# Patient Record
Sex: Female | Born: 1935 | Race: Black or African American | Hispanic: No | Smoking: Never smoker
Health system: Southern US, Community
[De-identification: ages and names within clinical notes are randomized; demographics above are authoritative.]

## PROBLEM LIST (undated history)

## (undated) DIAGNOSIS — J449 Chronic obstructive pulmonary disease, unspecified: Secondary | ICD-10-CM

## (undated) DIAGNOSIS — G4733 Obstructive sleep apnea (adult) (pediatric): Secondary | ICD-10-CM

## (undated) DIAGNOSIS — I10 Essential (primary) hypertension: Secondary | ICD-10-CM

## (undated) DIAGNOSIS — D649 Anemia, unspecified: Secondary | ICD-10-CM

## (undated) HISTORY — DX: Obstructive sleep apnea (adult) (pediatric): G47.33

## (undated) HISTORY — DX: Chronic obstructive pulmonary disease, unspecified: J44.9

## (undated) HISTORY — DX: Essential (primary) hypertension: I10

## (undated) HISTORY — DX: Anemia, unspecified: D64.9

---

## 1976-11-23 HISTORY — PX: ABDOMINAL HYSTERECTOMY: SHX81

## 2002-07-01 ENCOUNTER — Emergency Department (HOSPITAL_COMMUNITY): Admission: EM | Admit: 2002-07-01 | Discharge: 2002-07-01 | Payer: Self-pay | Admitting: Internal Medicine

## 2002-07-01 ENCOUNTER — Encounter: Payer: Self-pay | Admitting: Internal Medicine

## 2005-11-23 DIAGNOSIS — D649 Anemia, unspecified: Secondary | ICD-10-CM

## 2005-11-23 HISTORY — DX: Anemia, unspecified: D64.9

## 2006-07-05 HISTORY — PX: COLONOSCOPY: SHX174

## 2010-10-21 HISTORY — PX: ESOPHAGOGASTRODUODENOSCOPY: SHX1529

## 2010-10-21 HISTORY — PX: COLONOSCOPY: SHX174

## 2011-03-26 ENCOUNTER — Ambulatory Visit: Payer: Medicare PPO | Attending: Family Medicine

## 2011-03-26 DIAGNOSIS — Z6841 Body Mass Index (BMI) 40.0 and over, adult: Secondary | ICD-10-CM | POA: Insufficient documentation

## 2011-03-26 DIAGNOSIS — G4733 Obstructive sleep apnea (adult) (pediatric): Secondary | ICD-10-CM | POA: Insufficient documentation

## 2011-04-04 NOTE — Procedures (Signed)
NAMEMORGAINE, KIMBALL                   ACCOUNT NO.:  [id number]  MEDICAL RECORD NO.:  [id number]          PATIENT TYPE:  OUT  LOCATION:  SLEEP CENTER                 FACILITY:  [facility]  PHYSICIAN:  Allin Frix A. Gerilyn Pilgrim, M.D. DATE OF BIRTH:  03-Jun-1936  DATE OF STUDY:  03/26/2011                           NOCTURNAL POLYSOMNOGRAM  REFERRING PHYSICIAN:  LLOYD COMSTOCK  REFERRING PHYSICIAN:  Reynolds Bowl.  INDICATION FOR STUDY:  A 75 year old lady who presents with a diagnosis of obstructive sleep apnea syndrome documented by previous polysomnography.  This study is a CPAP titration and recording.  EPWORTH SLEEPINESS SCORE:  Six.  BMI 42.  MEDICATIONS:  Aspirin, Diovan, hydrochlorothiazide, meclizine, ranitidine.  SLEEP ARCHITECTURE:  The total recording time is 389 minutes.  Sleep efficiency 55%.  Sleep latency 7.5 minutes.  REM latency 83.5 minutes. Stage N1 of 24, N2 of 60, N3 of 1, and REM sleep 14.  RESPIRATORY DATA:  Baseline oxygen saturation is 96, lowest saturation 87.  The patient was titrated on CPAP and subsequently bilevel pressures.  She was started out at a CPAP of 5 and titrated to a high of 16, subsequently switched over from 16 to bilevel pressures because of some difficulty tolerating this.  She was titrated between bilevels of 18/14 to 20/16.  The patient did well on 16, but had some difficulties with this.  The optimal bilevel pressure, I believe, is 18/14.  CARDIAC DATA:  Average heart rate 62 with no significant dysrhythmias observed.  MOVEMENT-PARASOMNIA:  PLM index zero.  IMPRESSIONS-RECOMMENDATIONS:  Obstructive sleep apnea syndrome which responds well to bilevel pressures of 18/14.     Aina Rossbach A. Gerilyn Pilgrim, M.D. Electronically Signed    KAD/MEDQ  D:  04/03/2011 18:21:46  T:  04/04/2011 05:45:29  Job:  [id number]

## 2012-09-05 ENCOUNTER — Ambulatory Visit (INDEPENDENT_AMBULATORY_CARE_PROVIDER_SITE_OTHER): Payer: Medicare Other | Admitting: Urgent Care

## 2012-09-05 ENCOUNTER — Telehealth: Payer: Self-pay | Admitting: Urgent Care

## 2012-09-05 ENCOUNTER — Other Ambulatory Visit: Payer: Self-pay | Admitting: Gastroenterology

## 2012-09-05 ENCOUNTER — Encounter: Payer: Self-pay | Admitting: Urgent Care

## 2012-09-05 VITALS — BP 152/68 | HR 66 | Temp 98.0°F | Ht 65.0 in | Wt 239.4 lb

## 2012-09-05 DIAGNOSIS — K219 Gastro-esophageal reflux disease without esophagitis: Secondary | ICD-10-CM

## 2012-09-05 DIAGNOSIS — R1310 Dysphagia, unspecified: Secondary | ICD-10-CM

## 2012-09-05 MED ORDER — DEXLANSOPRAZOLE 60 MG PO CPDR: 60.0000 mg | DELAYED_RELEASE_CAPSULE | Freq: Every day | ORAL | Status: DC

## 2012-09-05 NOTE — Progress Notes (Addendum)
Referring Provider:McVeigh, Lanora Manis Primary Care Physician:  Rush Barer, [place] Primary Gastroenterologist:  Dr. Jonette Eva  Chief Complaint  Patient presents with  . Abdominal Pain    burning/ lower part   HPI:  Tamara Perry is a 76 y.o. female here as a referral from Dr. Agustin Cree for GERD for 3-4 months.  C/o heartburn & indigestion every day.  C/o chronic cough.  Tried omeprazole 20mg  daily & this helped for a while, but stopped working.  C/o dysphagia with solid foods .  She states she "feels like they get stuck" & points to her upper esophagus.  Denies problems with liquids.  Denies odynophagia.  She has had symptoms for 2 months.  She continues to gain weight.  She denies any hx of GERD.   Appetite ok.  Denies constipation, diarrhea, rectal bleeding, melena or weight loss.  C/o "burning" suprapubic pain internittent worse w/ coughing & movement.  Denies fever or chills. Denies urinary complaints.  Colonoscopy approx 5 yrs ago & she believes she may have had polyps.   Past Medical History  Diagnosis Date  . HTN (hypertension)   . OSA (obstructive sleep apnea)   . COPD (chronic obstructive pulmonary disease)   . Anemia 2007    Past Surgical History  Procedure Date  . Abdominal hysterectomy 1978    complete  . Colonoscopy 07/05/2006    Dr. Cliffton Asters Jr-diverticulosis, hemorrhoids  . Esophagogastroduodenoscopy 10/21/10    George-mod HH, CLOTest ?results  . Colonoscopy 10/21/10    George-diverticulosis    Current Outpatient Prescriptions  Medication Sig Dispense Refill  . aspirin 81 MG tablet Take 81 mg by mouth daily.      . valsartan-hydrochlorothiazide (DIOVAN-HCT) 320-12.5 MG per tablet Take 1 tablet by mouth daily.       Marland Kitchen dexlansoprazole (DEXILANT) 60 MG capsule Take 1 capsule (60 mg total) by mouth daily.  10 capsule  0    Allergies as of 09/05/2012  . (No Known Allergies)    Family History:There is no known family history of colorectal carcinoma , liver disease, or  inflammatory bowel disease.  Problem Relation Age of Onset  . Coronary artery disease Mother   . Seizures Brother   . Stroke Brother     History   Social History  . Marital Status: Divorced    Spouse Name: N/A    Number of Children: 1  . Years of Education: N/A   Occupational History  . retired Engineer, manufacturing systems work    Social History Main Topics  . Smoking status: Never Smoker   . Smokeless tobacco: Not on file  . Alcohol Use: No  . Drug Use: No  . Sexually Active: Not on file   Other Topics Concern  . Not on file   Social History Narrative   Lives alone1 son lives in [place]    Review of Systems: Gen: Denies any fatigue, weakness, malaise, weight loss, and sleep disorder CV: Denies chest pain, angina, palpitations, syncope, orthopnea, PND, and claudication. +trace left ankle edema noted at times. Resp: Denies dyspnea at rest, dyspnea with exercise, sputum, wheezing, coughing up blood, and pleurisy. GI: Denies vomiting blood, jaundice, and fecal incontinence. GU : Denies urinary burning, blood in urine, urinary frequency, urinary hesitancy, nocturnal urination, and urinary incontinence. MS: Denies joint pain, limitation of movement, and swelling, stiffness, low back pain, extremity pain. Denies muscle weakness, cramps, atrophy.  Derm: Denies rash, itching, dry skin, hives, moles, warts, or unhealing ulcers.  Psych: Denies depression, anxiety, memory loss, suicidal  ideation, hallucinations, paranoia, and confusion. Heme: Denies bruising, bleeding, and enlarged lymph nodes. Neuro:  Denies any headaches, dizziness, paresthesias. Endo:  Denies any problems with DM, thyroid, adrenal function.  Physical Exam: BP 152/68  Pulse 66  Temp 98 F (36.7 C) (Temporal)  Ht 5\' 5"  (1.651 m)  Wt 239 lb 6.4 oz (108.591 kg)  BMI 39.84 kg/m2 No LMP recorded. Patient has had a hysterectomy. General:   Alert,  Well-developed, obese, pleasant and cooperative in NAD Head:  Normocephalic and  atraumatic. Eyes:  Sclera clear, no icterus.   Conjunctiva pink. Ears:  Normal auditory acuity. Nose:  No deformity, discharge, or lesions. Mouth:  No deformity or lesions,oropharynx pink & moist. Neck:  Supple; no masses or thyromegaly. Lungs:  Clear throughout to auscultation.   No wheezes, crackles, or rhonchi. No acute distress. Heart:  Regular rate and rhythm; no murmurs, clicks, rubs,  or gallops. Abdomen:  Normal bowel sounds.  No bruits.  Soft, non-tender and non-distended without masses, hepatosplenomegaly or hernias noted.  No guarding or rebound tenderness.   Rectal:  Deferred. Msk:  Symmetrical without gross deformities. Pulses:  Normal pulses noted. Extremities:  No clubbing or edema. Neurologic:  Alert and oriented x4;  grossly normal neurologically. Skin:  Intact without significant lesions or rashes. Lymph Nodes:  No significant cervical adenopathy. Psych:  Alert and cooperative. Normal mood and affect.

## 2012-09-05 NOTE — Patient Instructions (Addendum)
EGD (upper endoscopy) with Dr Darrick Penna.  She may dilate your esophagus. Begin dexilant 60mg  daily for acid reflux. Follow up with your PCP about leg cramps We will request colonoscopy report to help determine when your next colonoscopy is due  Diet for Gastroesophageal Reflux Disease, Adult Reflux (acid reflux) is when acid from your stomach flows up into the esophagus. When acid comes in contact with the esophagus, the acid causes irritation and soreness (inflammation) in the esophagus. When reflux happens often or so severely that it causes damage to the esophagus, it is called gastroesophageal reflux disease (GERD). Nutrition therapy can help ease the discomfort of GERD. FOODS OR DRINKS TO AVOID OR LIMIT  Smoking or chewing tobacco. Nicotine is one of the most potent stimulants to acid production in the gastrointestinal tract.  Caffeinated and decaffeinated coffee and black tea.  Regular or low-calorie carbonated beverages or energy drinks (caffeine-free carbonated beverages are allowed).   Strong spices, such as black pepper, white pepper, red pepper, cayenne, curry powder, and chili powder.  Peppermint or spearmint.  Chocolate.  High-fat foods, including meats and fried foods. Extra added fats including oils, butter, salad dressings, and nuts. Limit these to less than 8 tsp per day.  Fruits and vegetables if they are not tolerated, such as citrus fruits or tomatoes.  Alcohol.  Any food that seems to aggravate your condition. If you have questions regarding your diet, call your caregiver or a registered dietitian. OTHER THINGS THAT MAY HELP GERD INCLUDE:   Eating your meals slowly, in a relaxed setting.  Eating 5 to 6 small meals per day instead of 3 large meals.  Eliminating food for a period of time if it causes distress.  Not lying down until 3 hours after eating a meal.  Keeping the head of your bed raised 6 to 9 inches (15 to 23 cm) by using a foam wedge or blocks under  the legs of the bed. Lying flat may make symptoms worse.  Being physically active. Weight loss may be helpful in reducing reflux in overweight or obese adults.  Wear loose fitting clothing EXAMPLE MEAL PLAN This meal plan is approximately 2,000 calories based on https://www.bernard.org/ meal planning guidelines. Breakfast   cup cooked oatmeal.  1 cup strawberries.  1 cup low-fat milk.  1 oz almonds. Snack  1 cup cucumber slices.  6 oz yogurt (made from low-fat or fat-free milk). Lunch  2 slice whole-wheat bread.  2 oz sliced [place].  2 tsp mayonnaise.  1 cup blueberries.  1 cup snap peas. Snack  6 whole-wheat crackers.  1 oz string cheese. Dinner   cup brown rice.  1 cup mixed veggies.  1 tsp olive oil.  3 oz grilled fish. Document Released: 11/09/2005 Document Revised: 02/01/2012 Document Reviewed: 09/25/2011 [facility] Patient Information 2013 [place], [place].

## 2012-09-05 NOTE — Progress Notes (Signed)
Faxed to PCP

## 2012-09-05 NOTE — Assessment & Plan Note (Addendum)
Tamara Perry is a pleasant 76 y.o. female with refractory heartburn & indigestion.  Failed omeprazole 20mg  daily.  EGD to look for complicated GERD, gastritis or PUD.   Trial Dexilant 60mg  daily.

## 2012-09-05 NOTE — Telephone Encounter (Signed)
Faxed request, waiting on reply

## 2012-09-05 NOTE — Assessment & Plan Note (Signed)
2 month hx of solid-food dysphagia.  Will need EGD with possible esophageal dilation with Dr. Darrick Penna to look for esophageal web, ring or stricture.  I have discussed risks & benefits which include, but are not limited to, bleeding, infection, perforation & drug reaction.  The patient agrees with this plan & written consent will be obtained.

## 2012-09-05 NOTE — Telephone Encounter (Signed)
Please call [place] [place] [facility] to get TCS report from approx 5 yrs ago & biopsy reports. Thanks

## 2012-09-06 ENCOUNTER — Encounter: Payer: Self-pay | Admitting: Urgent Care

## 2012-09-15 ENCOUNTER — Encounter (HOSPITAL_COMMUNITY): Payer: Self-pay | Admitting: Pharmacy Technician

## 2012-09-19 MED ORDER — SODIUM CHLORIDE 0.45 % IV SOLN
INTRAVENOUS | Status: DC
  Administered 2012-09-20: 10:00:00 via INTRAVENOUS

## 2012-09-20 ENCOUNTER — Ambulatory Visit (HOSPITAL_COMMUNITY)
Admission: RE | Admit: 2012-09-20 | Discharge: 2012-09-20 | Disposition: A | Discharge status: Home / Self Care | Payer: Medicare Other | Source: Ambulatory Visit | Attending: Gastroenterology | Admitting: Gastroenterology

## 2012-09-20 ENCOUNTER — Encounter (HOSPITAL_COMMUNITY)
Admission: RE | Disposition: A | Discharge status: Home / Self Care | Payer: Self-pay | Source: Ambulatory Visit | Attending: Gastroenterology

## 2012-09-20 ENCOUNTER — Encounter (HOSPITAL_COMMUNITY): Payer: Self-pay | Admitting: *Deleted

## 2012-09-20 DIAGNOSIS — I10 Essential (primary) hypertension: Secondary | ICD-10-CM | POA: Insufficient documentation

## 2012-09-20 DIAGNOSIS — K2970 Gastritis, unspecified, without bleeding: Secondary | ICD-10-CM

## 2012-09-20 DIAGNOSIS — K219 Gastro-esophageal reflux disease without esophagitis: Secondary | ICD-10-CM

## 2012-09-20 DIAGNOSIS — K2940 Chronic atrophic gastritis without bleeding: Secondary | ICD-10-CM | POA: Insufficient documentation

## 2012-09-20 DIAGNOSIS — R1310 Dysphagia, unspecified: Secondary | ICD-10-CM | POA: Insufficient documentation

## 2012-09-20 DIAGNOSIS — K2990 Gastroduodenitis, unspecified, without bleeding: Secondary | ICD-10-CM

## 2012-09-20 DIAGNOSIS — J449 Chronic obstructive pulmonary disease, unspecified: Secondary | ICD-10-CM | POA: Insufficient documentation

## 2012-09-20 DIAGNOSIS — J4489 Other specified chronic obstructive pulmonary disease: Secondary | ICD-10-CM | POA: Insufficient documentation

## 2012-09-20 HISTORY — PX: SAVORY DILATION: SHX5439

## 2012-09-20 HISTORY — PX: ESOPHAGOGASTRODUODENOSCOPY: SHX5428

## 2012-09-20 HISTORY — PX: MALONEY DILATION: SHX5535

## 2012-09-20 SURGERY — EGD (ESOPHAGOGASTRODUODENOSCOPY)
Anesthesia: Moderate Sedation

## 2012-09-20 MED ORDER — MEPERIDINE HCL 100 MG/ML IJ SOLN
INTRAMUSCULAR | Status: DC | PRN
  Administered 2012-09-20 (×2): 25 mg via INTRAVENOUS

## 2012-09-20 MED ORDER — MEPERIDINE HCL 100 MG/ML IJ SOLN
INTRAMUSCULAR | Status: AC
  Filled 2012-09-20: qty 2

## 2012-09-20 MED ORDER — STERILE WATER FOR IRRIGATION IR SOLN
Status: DC | PRN
  Administered 2012-09-20: 11:00:00

## 2012-09-20 MED ORDER — BUTAMBEN-TETRACAINE-BENZOCAINE 2-2-14 % EX AERO
INHALATION_SPRAY | CUTANEOUS | Status: DC | PRN
  Administered 2012-09-20: 2 via TOPICAL

## 2012-09-20 MED ORDER — MIDAZOLAM HCL 5 MG/5ML IJ SOLN
INTRAMUSCULAR | Status: DC | PRN
  Administered 2012-09-20: 1 mg via INTRAVENOUS
  Administered 2012-09-20: 2 mg via INTRAVENOUS

## 2012-09-20 MED ORDER — MIDAZOLAM HCL 5 MG/5ML IJ SOLN
INTRAMUSCULAR | Status: AC
  Filled 2012-09-20: qty 10

## 2012-09-20 NOTE — Op Note (Signed)
[facility] [address] [place], [address]   ENDOSCOPY PROCEDURE REPORT  PATIENT: Tamara Perry, Tamara Perry  MR#: [id number] BIRTHDATE: 01/20/1936 , 76  yrs. old GENDER: Female  ENDOSCOPIST: Jonette Eva, MD REFFERED XB:JYNW Claggett, PA-C  PROCEDURE DATE:  09/20/2012 PROCEDURE:   EGD with biopsy and EGD with dilatation over guidewire   INDICATIONS:1.  dysphagia. MEDICATIONS: Demerol 50 mg IV and Versed 3 mg IV TOPICAL ANESTHETIC: Cetacaine Spray  DESCRIPTION OF PROCEDURE:   After the risks benefits and alternatives of the procedure were thoroughly explained, informed consent was obtained.  The     endoscope was introduced through the mouth and advanced to the second portion of the duodenum.  The instrument was slowly withdrawn as the mucosa was carefully examined.  Prior to withdrawal of the scope, the guidwire was placed.  The esophagus was dilated successfully.  The patient was recovered in endoscopy and discharged home in satisfactory condition.      ESOPHAGUS: The mucosa of the esophagus appeared normal.  STOMACH: Mild non-erosive gastritis (inflammation) was found. Multiple biopsies were performed.  DUODENUM: The duodenal mucosa showed no abnormalities in the bulb and second portion of the duodenum.  Dilation was then performed at the gastroesphageal junction Dilator: Savary over guidewire Size(s): 14-16 mm , MINIMAL, NO HEME  COMPLICATIONS: There were no complications.   ENDOSCOPIC IMPRESSION: 1.   The mucosa of the esophagus appeared normal 2.   Non-erosive gastritis (inflammation) was found; multiple biopsies 3.   The duodenal mucosa showed no abnormalities in the bulb and second portion of the duodenum  RECOMMENDATIONS: AWAIT BIOPSY OMEPRAZOLE 30 MINUTES PRIOR TO FIRST MEAL OPV IN 3 MOS.  IF DYSPHAGIA CONTINUES, PT NEEDS BPE.      _______________________________ Rosalie DoctorJonette Eva, MD 09/20/2012 11:29 AM      PATIENT NAME:   [name], [name] MR#: [id number]

## 2012-09-20 NOTE — H&P (Signed)
  Primary Care Physician:  Rush Barer, PA Primary Gastroenterologist:  Dr. Darrick Penna  Pre-Procedure History & Physical: HPI:  Tamara Perry is a 76 y.o. female here for DYSPHAGIA.   Past Medical History  Diagnosis Date  . HTN (hypertension)   . OSA (obstructive sleep apnea)   . COPD (chronic obstructive pulmonary disease)   . Anemia 2007    Past Surgical History  Procedure Date  . Abdominal hysterectomy 1978    complete  . Colonoscopy 07/05/2006    Dr. Cliffton Asters Jr-diverticulosis, hemorrhoids  . Esophagogastroduodenoscopy 10/21/10    [facility]-mod HH, CLOTest ?results  . Colonoscopy 10/21/10    George-diverticulosis    Prior to Admission medications   Medication Sig Start Date End Date Taking? Authorizing Provider  aspirin 81 MG chewable tablet Chew 81 mg by mouth daily.   Yes Historical Provider, MD  omeprazole (PRILOSEC) 20 MG capsule Take 20 mg by mouth daily.   Yes Historical Provider, MD  valsartan-hydrochlorothiazide (DIOVAN-HCT) 320-12.5 MG per tablet Take 1 tablet by mouth daily.  08/08/12  Yes Historical Provider, MD    Allergies as of 09/05/2012  . (No Known Allergies)    Family History  Problem Relation Age of Onset  . Coronary artery disease Mother   . Seizures Brother   . Stroke Brother     History   Social History  . Marital Status: Divorced    Spouse Name: N/A    Number of Children: 1  . Years of Education: N/A   Occupational History  . retired Engineer, manufacturing systems work    Social History Main Topics  . Smoking status: Never Smoker   . Smokeless tobacco: Not on file  . Alcohol Use: No  . Drug Use: No  . Sexually Active: Not on file   Other Topics Concern  . Not on file   Social History Narrative   Lives alone1 son lives in [place]    Review of Systems: See HPI, otherwise negative ROS   Physical Exam: BP 164/77  Pulse 52  Temp 98 F (36.7 C) (Oral)  Resp 13  SpO2 99% General:   Alert,  pleasant and cooperative in NAD Head:  Normocephalic and  atraumatic. Neck:  Supple; Lungs:  Clear throughout to auscultation.    Heart:  Regular rate and rhythm. Abdomen:  Soft, nontender and nondistended. Normal bowel sounds, without guarding, and without rebound.   Neurologic:  Alert and  oriented x4;  grossly normal neurologically.  Impression/Plan:     DYSPHAGIA  PLAN:  EGD/DIL TODAY

## 2012-09-21 ENCOUNTER — Telehealth: Payer: Self-pay | Admitting: Gastroenterology

## 2012-09-21 NOTE — Telephone Encounter (Signed)
Path faxed to PCP 

## 2012-09-21 NOTE — Telephone Encounter (Signed)
Pt is aware of OV on 1/29 at 10 with SF in E30

## 2012-09-21 NOTE — Telephone Encounter (Signed)
Pt is aware of OV on 1/29 at 10 with SF

## 2012-09-21 NOTE — Telephone Encounter (Signed)
Please call pt. HER stomach Bx shows mild gastritis.    CONTINUE OMEPRAZOLE. TAKE 30 MINUTES PRIOR TO YOUR FIRST MEAL.  Follow a low fat diet.   FOLLOW UP IN 3 MOS.

## 2012-09-22 ENCOUNTER — Encounter (HOSPITAL_COMMUNITY): Payer: Self-pay | Admitting: Gastroenterology

## 2012-09-22 NOTE — Telephone Encounter (Signed)
Called and informed pt.  

## 2012-09-30 NOTE — Telephone Encounter (Signed)
Pt called this morning to get the results of her test. I gave her the results.

## 2012-10-05 NOTE — Progress Notes (Signed)
REVIEWED. AGREE. 

## 2012-12-20 ENCOUNTER — Encounter: Payer: Self-pay | Admitting: Gastroenterology

## 2012-12-21 ENCOUNTER — Ambulatory Visit: Payer: Medicare Other | Admitting: Gastroenterology

## 2013-01-05 ENCOUNTER — Ambulatory Visit: Payer: Medicare Other | Admitting: Gastroenterology

## 2013-01-31 ENCOUNTER — Telehealth: Payer: Self-pay

## 2013-01-31 NOTE — Telephone Encounter (Signed)
Pt called and was mixed up about her appt. She had one for tomorrow and had to reschedule for 03/13/2013 because she had another appt tomorrow. She said she never was called and she doesn't know what she is supposed to be doing. I told her I called her on 09/21/2013 and gave her results. It looks like she failed the omeprazole and was given Dexilant samples. She said she did not get any samples. Please advise!

## 2013-02-01 ENCOUNTER — Ambulatory Visit: Payer: Medicare Other | Admitting: Gastroenterology

## 2013-02-01 MED ORDER — OMEPRAZOLE 20 MG PO CPDR: DELAYED_RELEASE_CAPSULE | ORAL | Status: AC

## 2013-02-01 NOTE — Telephone Encounter (Signed)
Pt will need Rx sent to her pharmacy for bid dosing of the Omeprazole.

## 2013-02-01 NOTE — Telephone Encounter (Signed)
PLEASE CALL PT.  She should take Omeprazole 30 mins before meals twice daily and follow a low fat diet. LOSE 10 LBS. OPV IN APR 2014.

## 2013-02-01 NOTE — Telephone Encounter (Signed)
PLEASE CALL PT.  Rx sent.  

## 2013-02-01 NOTE — Telephone Encounter (Signed)
Pt aware.

## 2013-03-13 ENCOUNTER — Encounter: Payer: Self-pay | Admitting: Gastroenterology

## 2013-03-13 ENCOUNTER — Ambulatory Visit (INDEPENDENT_AMBULATORY_CARE_PROVIDER_SITE_OTHER): Payer: Medicare Other | Admitting: Gastroenterology

## 2013-03-13 VITALS — BP 158/72 | HR 59 | Temp 98.3°F | Ht 64.0 in | Wt 232.8 lb

## 2013-03-13 DIAGNOSIS — K219 Gastro-esophageal reflux disease without esophagitis: Secondary | ICD-10-CM

## 2013-03-13 DIAGNOSIS — R1310 Dysphagia, unspecified: Secondary | ICD-10-CM

## 2013-03-13 DIAGNOSIS — R1013 Epigastric pain: Secondary | ICD-10-CM | POA: Insufficient documentation

## 2013-03-13 NOTE — Assessment & Plan Note (Addendum)
MOST LIKELY DUE TO LACTOSE INTAKE.  USE LACTASE WHEN EATING DAIRY. OPV IN 6 MOS

## 2013-03-13 NOTE — Progress Notes (Signed)
Cc PCP 

## 2013-03-13 NOTE — Assessment & Plan Note (Signed)
SX RESOLVED.   CONTINUE OMEPRAZOLE.  TAKE 30 MINUTES PRIOR TO YOUR MEALS TWICE DAILY. FOLLOW A LOW FAT DIET.  LOSE WEIGHT OPV IN 6 MOS

## 2013-03-13 NOTE — Progress Notes (Signed)
  Subjective:    Patient ID: Tamara Perry, female    DOB: 12-Jan-1936, 77 y.o.   MRN: [id number]  PCP: CLAGETT   HPI PT REPORTS SHE DID NOT RECEIVE A REPORT FROM EGD OCT 2013. NOTES STATES RESULTS DISCUSSED 10/31 AND NOV 8. EXPLAINED RESULTS AND WHAT THEY MEANT.  STOMACH NOISY. DOESN'T REALLY HURT. BMs: USU #4. WAS EVERY AM AND SOMETIMES BID. MAY GO A LITTLE AM AND SOME IN PM. NO CHANGE IN DIET. MAY HAVE RUNNY STOOL OR MAY BE HARD. MILK W/ CEREAL: EVERY AM. EAT ICE CREAM 3-4 TIMES/DAY. CHEESE: CUT DOWN. CHEESE CONSTIPATES YOU. NO PROBLEM WITH MILK OR ICE CREAM. HAS HEARTBURN IF SHE EATS AND LAYS DOWN(1-2 X/WEEK).  PT DENIES FEVER, CHILLS, BRBPR, nausea, vomiting, melena, problems swallowing, problems with sedation.  Past Medical History  Diagnosis Date  . HTN (hypertension)   . OSA (obstructive sleep apnea)   . COPD (chronic obstructive pulmonary disease)   . Anemia 2007   Past Surgical History  Procedure Laterality Date  . Abdominal hysterectomy  1978    complete  . Colonoscopy  07/05/2006    Dr. Cliffton Asters Jr-diverticulosis, hemorrhoids  . Esophagogastroduodenoscopy  10/21/10    George-mod HH, CLOTest ?results  . Colonoscopy  10/21/10    George-diverticulosis  . Esophagogastroduodenoscopy  09/20/2012    SLF:The mucosa of the esophagus appeared normal Non-erosive gastritis (inflammation) was found; multiple biopsies The duodenal mucosa showed no abnormalities in the bulb and second portion of the duodenum  . Savory dilation  09/20/2012    Procedure: SAVORY DILATION;  Surgeon: West Bali, MD;  Location: [facility];  Service: Endoscopy;  Laterality: N/A;  [name] dilation  09/20/2012    Procedure: MALONEY DILATION;  Surgeon: West Bali, MD;  Location: [facility];  Service: Endoscopy;  Laterality: N/A;   No Known Allergies  Current Outpatient Prescriptions  Medication Sig Dispense Refill  . aspirin 81 MG chewable tablet Chew 81 mg by mouth daily.    Marland Kitchen omeprazole  (PRILOSEC) 20 MG capsule 1 PO 30 MINS PRIOR TO MEALS BID    . TRAVATAN Z 0.004 % SOLN ophthalmic solution Place 1 drop into both eyes at bedtime.     . valsartan-hydrochlorothiazide (DIOVAN-HCT) 320-12.5 MG per tablet Take 1 tablet by mouth daily.            Review of Systems     Objective:   Physical Exam  Vitals reviewed. Constitutional: She is oriented to person, place, and time. She appears well-nourished.  HENT:  Head: Normocephalic and atraumatic.  Mouth/Throat: Oropharynx is clear and moist. No oropharyngeal exudate.  Eyes: Pupils are equal, round, and reactive to light. No scleral icterus.  Neck: Normal range of motion. Neck supple.  Cardiovascular: Normal rate, regular rhythm and normal heart sounds.   Pulmonary/Chest: Effort normal and breath sounds normal. No respiratory distress.  Abdominal: Soft. Bowel sounds are normal. She exhibits no distension. There is no tenderness.  Lymphadenopathy:    She has no cervical adenopathy.  Neurological: She is alert and oriented to person, place, and time.  NO FOCAL DEFICITS   Psychiatric: She has a normal mood and affect.          Assessment & Plan:

## 2013-03-13 NOTE — Progress Notes (Signed)
Reminder in epic °

## 2013-03-13 NOTE — Patient Instructions (Signed)
YOUR STOMACH MAY BE ROLLING DUE TO DAIRY IN YOUR DIET. YOUR STOMACH BIOPSY SHOW GASTRITIS FROM ASPRIN USE. OMEPRAZOLE TREATS GASTRITIS AND REFLUX(HEARTBURN).   CONTINUE OMEPRAZOLE.  TAKE 30 MINUTES PRIOR TO YOUR MEALS TWICE DAILY.  FOLLOW A LOW FAT DIET AND CUT DOWN ON EATING DAIRY. SEE INFO BELOW ON HOW TO CUT DOWN ON DAIRY IN YOUR DIET.  CONTINUE YOUR WEIGHT LOSS EFFORT.  IF YOU FEEL LIKE EATING DAIRY, TAKE LACTASE PILLS 2 OR 3 WITH MEALS OR SNACKS THAT CONTAIN DAIRY. YOU CAN ALSO BUY LACTOSE FREE ICE CREAM, OR DRINK LACTAID MILK.  FOLLOW UP IN OCT 2014.   LOW LACTOSE Diet Lactose is a carbohydrate that is found mainly in milk and milk products, as well as in foods with added milk or whey. Lactose must be digested by the enzyme in order to be used by the body. Lactose intolerance occurs when there is a shortage of lactase. When your body is not able to digest lactose, you may feel sick to your stomach (nausea), bloating, cramping, gas and diarrhea.  There are many dairy products that may be tolerated better than milk by some people:  The use of cultured dairy products such as yogurt, buttermilk, cottage cheese, and sweet acidophilus milk (Kefir) for lactase-deficient individuals is usually well tolerated. This is because the healthy bacteria help digest lactose.   Lactose-hydrolyzed milk (Lactaid) contains 40-90% less lactose than milk and may also be well tolerated.    SPECIAL NOTES  Lactose is a carbohydrates. The major food source is dairy products. Reading food labels is important. Many products contain lactose even when they are not made from milk. Look for the following words: whey, milk solids, dry milk solids, nonfat dry milk powder. Typical sources of lactose other than dairy products include breads, candies, cold cuts, prepared and processed foods, and commercial sauces and gravies.   All foods must be prepared without milk, cream, or other dairy foods.   Soy milk and  lactose-free supplements (LACTASE) may be used as an alternative to milk.   FOOD GROUP ALLOWED/RECOMMENDED AVOID/USE SPARINGLY  BREADS / STARCHES 4 servings or more* Breads and rolls made without milk. [place], [place], or [place] bread. Breads and rolls that contain milk. Prepared mixes such as muffins, biscuits, waffles, pancakes. Sweet rolls, donuts, [place] toast (if made with milk or lactose).  Crackers: Soda crackers, graham crackers. Any crackers prepared without lactose. Zwieback crackers, corn curls, or any that contain lactose.  Cereals: Cooked or dry cereals prepared without lactose (read labels). Cooked or dry cereals prepared with lactose (read labels). Total, Cocoa Krispies. Special K.  Potatoes / Pasta / Rice: Any prepared without milk or lactose. Popcorn. Instant potatoes, frozen [place] fries, scalloped or au gratin potatoes.  VEGETABLES 2 servings or more Fresh, frozen, and canned vegetables. Creamed or breaded vegetables. Vegetables in a cheese sauce or with lactose-containing margarines.  FRUIT 2 servings or more All fresh, canned, or frozen fruits that are not processed with lactose. Any canned or frozen fruits processed with lactose.  MEAT & SUBSTITUTES 2 servings or more (4 to 6 oz. total per day) Plain beef, chicken, fish, [place], lamb, veal, pork, or ham. Kosher prepared meat products. Strained or junior meats that do not contain milk. Eggs, soy meat substitutes, nuts. Scrambled eggs, omelets, and souffles that contain milk. Creamed or breaded meat, fish, or fowl. Sausage products such as wieners, liver sausage, or cold cuts that contain milk solids. Cheese, cottage cheese, or cheese spreads.  MILK  None. (See "BEVERAGES" for milk substitutes. See "DESSERTS" for ice cream and frozen desserts.) Milk (whole, 2%, skim, or chocolate). Evaporated, powdered, or condensed milk; malted milk.  SOUPS & COMBINATION FOODS Bouillon, broth, vegetable soups, clear soups, consomms. Homemade  soups made with allowed ingredients. Combination or prepared foods that do not contain milk or milk products (read labels). Cream soups, chowders, commercially prepared soups containing lactose. Macaroni and cheese, pizza. Combination or prepared foods that contain milk or milk products.  DESSERTS & SWEETS In moderation Water and fruit ices; gelatin; angel food cake. Homemade cookies, pies, or cakes made from allowed ingredients. Pudding (if made with water or a milk substitute). Lactose-free tofu desserts. Sugar, honey, corn syrup, jam, jelly; marmalade; molasses (beet sugar); Pure sugar candy; marshmallows. Ice cream, ice milk, sherbet, custard, pudding, frozen yogurt. Commercial cake and cookie mixes. Desserts that contain chocolate. Pie crust made with milk-containing margarine; reduced-calorie desserts made with a sugar substitute that contains lactose. Toffee, peppermint, butterscotch, chocolate, caramels.  FATS & OILS In moderation Butter (as tolerated; contains very small amounts of lactose). Margarines and dressings that do not contain milk, Vegetable oils, shortening, Miracle Whip, mayonnaise, nondairy cream & whipped toppings without lactose or milk solids added (examples: Coffee Rich, Carnation Coffeemate, Rich's Whipped Topping, PolyRich). Tomasa Blase. Margarines and salad dressings containing milk; cream, cream cheese; peanut butter with added milk solids, sour cream, chip dips, made with sour cream.  BEVERAGES Carbonated drinks; tea; coffee and freeze-dried coffee; some instant coffees (check labels). Fruit drinks; fruit and vegetable juice; Rice or Soy milk. Ovaltine, hot chocolate. Some cocoas; some instant coffees; instant iced teas; powdered fruit drinks (read labels).   CONDIMENTS / MISCELLANEOUS Soy sauce, carob powder, olives, gravy made with water, baker's cocoa, pickles, pure seasonings and spices, wine, pure monosodium glutamate, catsup, mustard. Some chewing gums, chocolate, some  cocoas. Certain antibiotics and vitamin / mineral preparations. Spice blends if they contain milk products. MSG extender. Artificial sweeteners that contain lactose such as Equal (Nutra-Sweet) and Sweet 'n Low. Some nondairy creamers (read labels).   SAMPLE MENU*  Breakfast   Orange Juice.  Banana.   Bran flakes.   Nondairy Creamer.  [place]ienna Bread (toasted).   Butter or milk-free margarine.   Coffee or tea.    Noon Meal   Chicken Breast.  Rice.   Green beans.   Butter or milk-free margarine.  Fresh melon.   Coffee or tea.    Evening Meal   Roast Beef.  Baked potato.   Butter or milk-free margarine.   Broccoli.   Lettuce salad with vinegar and oil dressing.  [employer].   Coffee or tea.

## 2013-03-13 NOTE — Assessment & Plan Note (Signed)
SX FAIRLY WELL CONTROLLED.  CONTINUE OMEPRAZOLE.  TAKE 30 MINUTES PRIOR TO YOUR MEALS TWICE DAILY. FOLLOW A LOW FAT DIET.  LOSE WEIGHT OPV IN 6 MOS

## 2016-02-26 ENCOUNTER — Encounter (HOSPITAL_COMMUNITY): Payer: Self-pay | Admitting: Emergency Medicine

## 2016-02-26 ENCOUNTER — Emergency Department (HOSPITAL_COMMUNITY): Payer: Medicare HMO

## 2016-02-26 ENCOUNTER — Emergency Department (HOSPITAL_COMMUNITY)
Admission: EM | Admit: 2016-02-26 | Discharge: 2016-02-26 | Disposition: A | Discharge status: Home / Self Care | Payer: Medicare HMO | Attending: Emergency Medicine | Admitting: Emergency Medicine

## 2016-02-26 DIAGNOSIS — R05 Cough: Secondary | ICD-10-CM | POA: Diagnosis not present

## 2016-02-26 DIAGNOSIS — Z7982 Long term (current) use of aspirin: Secondary | ICD-10-CM | POA: Diagnosis not present

## 2016-02-26 DIAGNOSIS — Z79899 Other long term (current) drug therapy: Secondary | ICD-10-CM | POA: Insufficient documentation

## 2016-02-26 DIAGNOSIS — M7989 Other specified soft tissue disorders: Secondary | ICD-10-CM | POA: Insufficient documentation

## 2016-02-26 DIAGNOSIS — R42 Dizziness and giddiness: Secondary | ICD-10-CM

## 2016-02-26 DIAGNOSIS — I10 Essential (primary) hypertension: Secondary | ICD-10-CM | POA: Diagnosis not present

## 2016-02-26 DIAGNOSIS — J449 Chronic obstructive pulmonary disease, unspecified: Secondary | ICD-10-CM | POA: Diagnosis not present

## 2016-02-26 LAB — COMPREHENSIVE METABOLIC PANEL
ALK PHOS: 61 U/L (ref 38–126)
ALT: 16 U/L (ref 14–54)
ANION GAP: 8 (ref 5–15)
AST: 16 U/L (ref 15–41)
Albumin: 4.1 g/dL (ref 3.5–5.0)
BUN: 16 mg/dL (ref 6–20)
CALCIUM: 9.2 mg/dL (ref 8.9–10.3)
CO2: 31 mmol/L (ref 22–32)
Chloride: 104 mmol/L (ref 101–111)
Creatinine, Ser: 0.77 mg/dL (ref 0.44–1.00)
Glucose, Bld: 91 mg/dL (ref 65–99)
Potassium: 3.6 mmol/L (ref 3.5–5.1)
SODIUM: 143 mmol/L (ref 135–145)
TOTAL PROTEIN: 6.9 g/dL (ref 6.5–8.1)
Total Bilirubin: 0.6 mg/dL (ref 0.3–1.2)

## 2016-02-26 LAB — CBC WITH DIFFERENTIAL/PLATELET
Basophils Absolute: 0 10*3/uL (ref 0.0–0.1)
Basophils Relative: 0 %
EOS ABS: 0.1 10*3/uL (ref 0.0–0.7)
EOS PCT: 2 %
HCT: 35 % — ABNORMAL LOW (ref 36.0–46.0)
HEMOGLOBIN: 11.2 g/dL — AB (ref 12.0–15.0)
LYMPHS ABS: 2 10*3/uL (ref 0.7–4.0)
Lymphocytes Relative: 33 %
MCH: 25.6 pg — AB (ref 26.0–34.0)
MCHC: 32 g/dL (ref 30.0–36.0)
MCV: 79.9 fL (ref 78.0–100.0)
MONOS PCT: 14 %
Monocytes Absolute: 0.8 10*3/uL (ref 0.1–1.0)
NEUTROS PCT: 51 %
Neutro Abs: 3 10*3/uL (ref 1.7–7.7)
Platelets: 253 10*3/uL (ref 150–400)
RBC: 4.38 MIL/uL (ref 3.87–5.11)
RDW: 13.9 % (ref 11.5–15.5)
WBC: 5.9 10*3/uL (ref 4.0–10.5)

## 2016-02-26 LAB — TROPONIN I

## 2016-02-26 MED ORDER — MECLIZINE HCL 12.5 MG PO TABS
25.0000 mg | ORAL_TABLET | Freq: Once | ORAL | Status: AC
  Administered 2016-02-26: 25 mg via ORAL
  Filled 2016-02-26: qty 2

## 2016-02-26 MED ORDER — MECLIZINE HCL 25 MG PO TABS: 25.0000 mg | ORAL_TABLET | Freq: Three times a day (TID) | ORAL | Status: DC | PRN

## 2016-02-26 MED ORDER — MECLIZINE HCL 25 MG PO TABS: 25.0000 mg | ORAL_TABLET | Freq: Three times a day (TID) | ORAL | Status: AC | PRN

## 2016-02-26 NOTE — ED Provider Notes (Addendum)
CSN: [id number]     Arrival date & time 02/26/16  0941 History  By signing my name below, I, Marica Otter, attest that this documentation has been prepared under the direction and in the presence of Vanetta Mulders, MD. Electronically Signed: Marica Otter, ED Scribe. 02/26/2016. 12:11 PM.  Chief Complaint  Patient presents with  . Dizziness   Patient is a 80 y.o. female presenting with dizziness. The history is provided by the patient. No language interpreter was used.  Dizziness Associated symptoms: no chest pain, no diarrhea, no headaches, no nausea, no shortness of breath and no vomiting    PCP: CLAGGETT,ELIN, PA-C HPI Comments: Tamara Perry is a 80 y.o. female, with PMHx noted below including HTN, COPD, and anemia who presents to the Emergency Department complaining of recurrent, intermittent dizziness (room spinning) onset one week ago. [facility] Rx her a med with a 5 day course, pt cannot recall the identity of the med. Pt reports she was compliant with said med without improvement. Pt notes she had a similar episode approximately one month ago. Pt also notes she has been ill with cold like Sx recently including cough and congestion. Pt denies having a pacemaker. Pt denies any recent falls or injuries. Pt further denies fever, chills, rhinorrhea, sore throat, SOB, visual disturbances, abd pain, n/v/d, dysuria, hematuria, back pain, new swelling of legs (swelling of BLE at baseline, no changes), headache, lightheadedness, or any new rashes. Pt further denies Hx of bleeding easily/blood thinner use.  Past Medical History  Diagnosis Date  . HTN (hypertension)   . OSA (obstructive sleep apnea)   . COPD (chronic obstructive pulmonary disease) (HCC)   . Anemia 2007   Past Surgical History  Procedure Laterality Date  . Abdominal hysterectomy  1978    complete  . Colonoscopy  07/05/2006    Dr. Cliffton Asters Jr-diverticulosis, hemorrhoids  .  Esophagogastroduodenoscopy  10/21/10    George-mod HH, CLOTest ?results  . Colonoscopy  10/21/10    George-diverticulosis  . Esophagogastroduodenoscopy  09/20/2012    [facility]:The mucosa of the esophagus appeared normal Non-erosive gastritis (inflammation) was found; multiple biopsies The duodenal mucosa showed no abnormalities in the bulb and second portion of the duodenum  . Savory dilation  09/20/2012    Procedure: SAVORY DILATION;  Surgeon: West Bali, MD;  Location: [facility];  Service: Endoscopy;  Laterality: N/A;  [name] dilation  09/20/2012    Procedure: MALONEY DILATION;  Surgeon: West Bali, MD;  Location: [facility];  Service: Endoscopy;  Laterality: N/A;   Family History  Problem Relation Age of Onset  . Coronary artery disease Mother   . Seizures Brother   . Stroke Brother    Social History  Substance Use Topics  . Smoking status: Never Smoker   . Smokeless tobacco: None  . Alcohol Use: No   OB History    No data available     Review of Systems  Constitutional: Negative for fever and chills.  HENT: Positive for congestion. Negative for rhinorrhea and sore throat.   Eyes: Negative for visual disturbance.  Respiratory: Positive for cough. Negative for shortness of breath.   Cardiovascular: Positive for leg swelling. Negative for chest pain.  Gastrointestinal: Negative for nausea, vomiting, abdominal pain and diarrhea.  Genitourinary: Negative for dysuria.  Musculoskeletal: Negative for back pain.  Skin: Negative for rash.  Neurological: Positive for dizziness. Negative for headaches.  Hematological: Does  not bruise/bleed easily.  Psychiatric/Behavioral: Negative for confusion.   Allergies  Review of patient's allergies indicates no known allergies.  Home Medications   Prior to Admission medications   Medication Sig Start Date End Date Taking? Authorizing Provider  amoxicillin (AMOXIL) 875 MG tablet Take 875 mg by mouth 2 (two) times daily.   Yes  Historical Provider, MD  aspirin 81 MG chewable tablet Chew 81 mg by mouth daily.   Yes Historical Provider, MD  lidocaine-prilocaine (EMLA) cream Apply 1 application topically daily as needed (rash).  02/17/16  Yes Historical Provider, MD  losartan-hydrochlorothiazide (HYZAAR) 50-12.5 MG tablet Take 1 tablet by mouth daily.   Yes Historical Provider, MD  meclizine (ANTIVERT) 25 MG tablet Take 1 tablet by mouth daily as needed for dizziness.  01/22/16  Yes Historical Provider, MD  meloxicam (MOBIC) 7.5 MG tablet Take 1 tablet by mouth 2 (two) times daily. 12/17/15  Yes Historical Provider, MD  omeprazole (PRILOSEC) 20 MG capsule 1 PO 30 MINS PRIOR TO MEALS BID Patient taking differently: Take 20 mg by mouth 2 (two) times daily before a meal. 1 PO 30 MINS PRIOR TO MEALS BID 02/01/13  Yes Sandi L Fields, MD  TRAVATAN Z 0.004 % SOLN ophthalmic solution Place 1 drop into both eyes at bedtime.  03/09/13  Yes Historical Provider, MD  meclizine (ANTIVERT) 25 MG tablet Take 1 tablet (25 mg total) by mouth 3 (three) times daily as needed for dizziness. 02/26/16   Vanetta MuldersScott Ariany Kesselman, MD   Triage Vitals: BP 163/66 mmHg  Pulse 59  Temp(Src) 98.5 F (36.9 C) (Oral)  Resp 20  Ht 5\' 5"  (1.651 m)  Wt 270 lb (122.471 kg)  BMI 44.93 kg/m2  SpO2 98% Physical Exam  Constitutional: She is oriented to person, place, and time. She appears well-developed and well-nourished. No distress.  HENT:  Head: Normocephalic and atraumatic.  Mouth/Throat: Mucous membranes are normal.  Eyes: EOM are normal. Pupils are equal, round, and reactive to light. No scleral icterus.  Eyes track normal   Neck: Normal range of motion.  Cardiovascular: Normal rate, regular rhythm and normal heart sounds.   Pulmonary/Chest: Effort normal and breath sounds normal.  Abdominal: Soft. Bowel sounds are normal. She exhibits no distension. There is no tenderness.  Musculoskeletal: Normal range of motion. She exhibits no edema (No significant swelling  of BLE).  Neurological: She is alert and oriented to person, place, and time.  No pronator drift   Skin: Skin is warm and dry.  Psychiatric: She has a normal mood and affect. Judgment normal.  Nursing note and vitals reviewed.   ED Course  Procedures (including critical care time) DIAGNOSTIC STUDIES: Oxygen Saturation is 98% on ra, nl by my interpretation.    COORDINATION OF CARE: 11:52 AM: Discussed treatment plan which includes labs and head CT and labs with pt at bedside; patient verbalizes understanding and agrees with treatment plan.  Labs Review Labs Reviewed  CBC WITH DIFFERENTIAL/PLATELET - Abnormal; Notable for the following:    Hemoglobin 11.2 (*)    HCT 35.0 (*)    MCH 25.6 (*)    All other components within normal limits  COMPREHENSIVE METABOLIC PANEL  TROPONIN I   Results for orders placed or performed during the hospital encounter of 02/26/16  CBC with Differential  Result Value Ref Range   WBC 5.9 4.0 - 10.5 K/uL   RBC 4.38 3.87 - 5.11 MIL/uL   Hemoglobin 11.2 (L) 12.0 - 15.0 g/dL   HCT 09.835.0 (L)  36.0 - 46.0 %   MCV 79.9 78.0 - 100.0 fL   MCH 25.6 (L) 26.0 - 34.0 pg   MCHC 32.0 30.0 - 36.0 g/dL   RDW 16.1 09.6 - 04.5 %   Platelets 253 150 - 400 K/uL   Neutrophils Relative % 51 %   Neutro Abs 3.0 1.7 - 7.7 K/uL   Lymphocytes Relative 33 %   Lymphs Abs 2.0 0.7 - 4.0 K/uL   Monocytes Relative 14 %   Monocytes Absolute 0.8 0.1 - 1.0 K/uL   Eosinophils Relative 2 %   Eosinophils Absolute 0.1 0.0 - 0.7 K/uL   Basophils Relative 0 %   Basophils Absolute 0.0 0.0 - 0.1 K/uL  Comprehensive metabolic panel  Result Value Ref Range   Sodium 143 135 - 145 mmol/L   Potassium 3.6 3.5 - 5.1 mmol/L   Chloride 104 101 - 111 mmol/L   CO2 31 22 - 32 mmol/L   Glucose, Bld 91 65 - 99 mg/dL   BUN 16 6 - 20 mg/dL   Creatinine, Ser 4.09 0.44 - 1.00 mg/dL   Calcium 9.2 8.9 - 81.1 mg/dL   Total Protein 6.9 6.5 - 8.1 g/dL   Albumin 4.1 3.5 - 5.0 g/dL   AST 16 15 - 41 U/L    ALT 16 14 - 54 U/L   Alkaline Phosphatase 61 38 - 126 U/L   Total Bilirubin 0.6 0.3 - 1.2 mg/dL   GFR calc non Af Amer >60 >60 mL/min   GFR calc Af Amer >60 >60 mL/min   Anion gap 8 5 - 15  Troponin I  Result Value Ref Range   Troponin I <0.03 <0.031 ng/mL    Imaging Review Dg Chest 2 View  02/26/2016  CLINICAL DATA:  Dizziness for 1 week.  Hypertension. EXAM: CHEST  2 VIEW COMPARISON:  None. FINDINGS: There is no edema or consolidation. Heart is upper normal in size with pulmonary vascularity within normal limits. No adenopathy. There is degenerative change in the thoracic spine. IMPRESSION: No edema or consolidation.  Heart upper normal in size. Electronically Signed   By: Bretta Bang III M.D.   On: 02/26/2016 13:01   Ct Head Wo Contrast  02/26/2016  CLINICAL DATA:  Dizziness for 1 week.  Hypertension. EXAM: CT HEAD WITHOUT CONTRAST TECHNIQUE: Contiguous axial images were obtained from the base of the skull through the vertex without intravenous contrast. COMPARISON:  July 01, 2002 FINDINGS: Age related volume loss is stable. There is no intracranial mass, hemorrhage, extra-axial fluid collection, or midline shift. There is mild small vessel disease in the centra semiovale bilaterally, most notably in the anterior superior left centrum semiovale, stable. There is no new gray-white compartment lesion. No acute infarct evident. The bony calvarium appears intact. The mastoid air cells are clear. The visualized orbits appear symmetric bilaterally. Extensive ossification in the falx is stable and benign in appearance. IMPRESSION: Age related volume loss with fairly mild and stable periventricular small vessel disease. No acute infarct evident. No intracranial mass, hemorrhage, or extra-axial fluid collection. Electronically Signed   By: Bretta Bang III M.D.   On: 02/26/2016 13:09   Mr Brain Wo Contrast  02/26/2016  CLINICAL DATA:  Dizziness over the last week.  Vertigo. EXAM: MRI HEAD  WITHOUT CONTRAST TECHNIQUE: Multiplanar, multiecho pulse sequences of the brain and surrounding structures were obtained without intravenous contrast. COMPARISON:  Of the CT of the head from the same day. FINDINGS: The diffusion-weighted images demonstrate no evidence  for acute or subacute infarction. Mild generalized atrophy is present. Ventricles are moderately enlarged. This is somewhat disproportionate to the degree of atrophy. Periventricular T2 changes are noted bilaterally. Moderate diffuse subcortical white matter disease is present bilaterally. No acute hemorrhage or mass lesion is present. 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 The internal auditory canals are within normal limits bilaterally. The brainstem and cerebellum are unremarkable. Flow is present in the major intracranial arteries. The globes and orbits are intact. The paranasal sinuses and mastoid air cells are clear. There is a heterogeneous lesion in the left side of the sella measuring 11 mm in cephalo caudad dimension. There is slight rightward deviation of the pituitary stalk. The optic chiasm is within normal limits. There is no definite invasion of the cavernous sinus. IMPRESSION: 1. 11 mm heterogeneous lesion along the left side of the sella. The differential diagnosis includes a pituitary macroadenoma. Craniopharyngioma is considered less likely. Metastatic disease is also considered less likely. This does not appear to be any aneurysm. Recommend nonemergent follow-up MRI the brain with contrast to include pituitary imaging. 2. Moderate prominence of the lateral ventricles. Normal pressure hydrocephalus is considered. 3. Moderate diffuse white matter disease bilaterally. This is nonspecific, but likely reflects the sequela of chronic microvascular ischemia. This appears to be long-standing. Electronically Signed   By: Marin Roberts M.D.   On: 02/26/2016 15:32   I have personally reviewed and evaluated these images  and lab results as part of my medical decision-making.   EKG Interpretation   Date/Time:  Wednesday February 26 2016 12:11:44 EDT Ventricular Rate:  54 PR Interval:  239 QRS Duration: 111 QT Interval:  610 QTC Calculation: 578 R Axis:   -6 Text Interpretation:  Sinus rhythm Prolonged PR interval Low voltage,  precordial leads Abnormal R-wave progression, early transition Borderline  T abnormalities, lateral leads Prolonged QT interval No previous ECGs  available Confirmed by Avaline Stillson  MD, Chaslyn Eisen [phone]) on 02/26/2016 12:18:01  PM      MDM   Final diagnoses:  Vertigo    Patient with recent history of upper respiratory infection treated by family doctor with amoxicillin. Patient's been having room spinning vertigo is had this in the past was treated back in January with anti-vert. Patient states it was severe enough at times that she slept on the floor because she was afraid to walk. Workup here today without any significant lab abnormalities. EKG showed sinus rhythm. Patient's head CT was negative chest x-ray was negative. No evidence of pneumonia. MRI of brain is currently pending if negative will treat with anti-Byrd and have her follow-up with primary care doctor. Patient responded well to Antivert given here orally. The room spinning has minimized significantly.   MRI results noted. No evidence of any stroke or tumor that would be responsible for the vertigo. However there is concerns about something along the pituitary left side of the sella. Will have patient follow-up with neurology for this. We'll give referral information. They're recommending a specific type MRI to evaluate this in more detail. This can be done as an outpatient.  I personally performed the services described in this documentation, which was scribed in my presence. The recorded information has been reviewed and is accurate.      Vanetta Mulders, MD 02/26/16 1527  Vanetta Mulders, MD 02/26/16 1539

## 2016-02-26 NOTE — ED Notes (Signed)
Pt states the dizziness started a week ago, went to family doctor and was given amoxicillin , pt state she is not better , had to sleep in the floor last night, feels like  The room is spinning and she did not want to fall.

## 2016-02-26 NOTE — Discharge Instructions (Signed)
Benign Positional Vertigo °Vertigo is the feeling that you or your surroundings are moving when they are not. Benign positional vertigo is the most common form of vertigo. The cause of this condition is not serious (is benign). This condition is triggered by certain movements and positions (is positional). This condition can be dangerous if it occurs while you are doing something that could endanger you or others, such as driving.  °CAUSES °In many cases, the cause of this condition is not known. It may be caused by a disturbance in an area of the inner ear that helps your brain to sense movement and balance. This disturbance can be caused by a viral infection (labyrinthitis), head injury, or repetitive motion. °RISK FACTORS °This condition is more likely to develop in: °· Women. °· People who are 50 years of age or older. °SYMPTOMS °Symptoms of this condition usually happen when you move your head or your eyes in different directions. Symptoms may start suddenly, and they usually last for less than a minute. Symptoms may include: °· Loss of balance and falling. °· Feeling like you are spinning or moving. °· Feeling like your surroundings are spinning or moving. °· Nausea and vomiting. °· Blurred vision. °· Dizziness. °· Involuntary eye movement (nystagmus). °Symptoms can be mild and cause only slight annoyance, or they can be severe and interfere with daily life. Episodes of benign positional vertigo may return (recur) over time, and they may be triggered by certain movements. Symptoms may improve over time. °DIAGNOSIS °This condition is usually diagnosed by medical history and a physical exam of the head, neck, and ears. You may be referred to a health care provider who specializes in ear, nose, and throat (ENT) problems (otolaryngologist) or a provider who specializes in disorders of the nervous system (neurologist). You may have additional testing, including: °· MRI. °· A CT scan. °· Eye movement tests. Your  health care provider may ask you to change positions quickly while he or she watches you for symptoms of benign positional vertigo, such as nystagmus. Eye movement may be tested with an electronystagmogram (ENG), caloric stimulation, the Dix-Hallpike test, or the roll test. °· An electroencephalogram (EEG). This records electrical activity in your brain. °· Hearing tests. °TREATMENT °Usually, your health care provider will treat this by moving your head in specific positions to adjust your inner ear back to normal. Surgery may be needed in severe cases, but this is rare. In some cases, benign positional vertigo may resolve on its own in 2-4 weeks. °HOME CARE INSTRUCTIONS °Safety °· Move slowly. Avoid sudden body or head movements. °· Avoid driving. °· Avoid operating heavy machinery. °· Avoid doing any tasks that would be dangerous to you or others if a vertigo episode would occur. °· If you have trouble walking or keeping your balance, try using a cane for stability. If you feel dizzy or unstable, sit down right away. °· Return to your normal activities as told by your health care provider. Ask your health care provider what activities are safe for you. °General Instructions °· Take over-the-counter and prescription medicines only as told by your health care provider. °· Avoid certain positions or movements as told by your health care provider. °· Drink enough fluid to keep your urine clear or pale yellow. °· Keep all follow-up visits as told by your health care provider. This is important. °SEEK MEDICAL CARE IF: °· You have a fever. °· Your condition gets worse or you develop new symptoms. °· Your family or friends   notice any behavioral changes.  Your nausea or vomiting gets worse.  You have numbness or a "pins and needles" sensation. SEEK IMMEDIATE MEDICAL CARE IF:  You have difficulty speaking or moving.  You are always dizzy.  You faint.  You develop severe headaches.  You have weakness in your  legs or arms.  You have changes in your hearing or vision.  You develop a stiff neck.  You develop sensitivity to light.   This information is not intended to replace advice given to you by your health care provider. Make sure you discuss any questions you have with your health care provider.   Take the Antivert as directed. Make an appointment to follow-up with your regular doctor. Return for any new or worse symptoms.   As we discussed the MRI raised some concern about something alongside your pituitary gland on the brain. This would not be related to the concerns for the vertigo at this point in time. [facility] Dr. doing quad call phone # [phone]. They are recommending a specific type of MRI to evaluate this further this is not an emergency. But should be done sometime over the next few weeks.      Document Released: 08/17/2006 Document Revised: 07/31/2015 Document Reviewed: 03/04/2015 [employer] Interactive Patient Education [employer]

## 2016-04-11 IMAGING — MR MR HEAD W/O CM
6 of 10 series · 21 of 48 positions shown · non-contrast
Comparison: Of the CT of the head from the same day.

CLINICAL DATA: Dizziness over the last week.  Vertigo.

EXAM:
MRI HEAD WITHOUT CONTRAST
TECHNIQUE: Multiplanar, multiecho pulse sequences of the brain and surrounding
structures were obtained without intravenous contrast.

[Series 5: T1 · sagittal · 5.0mm · 0.45mm/px · 2 of 21 slices shown (1 of 2)]
[im 1/21]
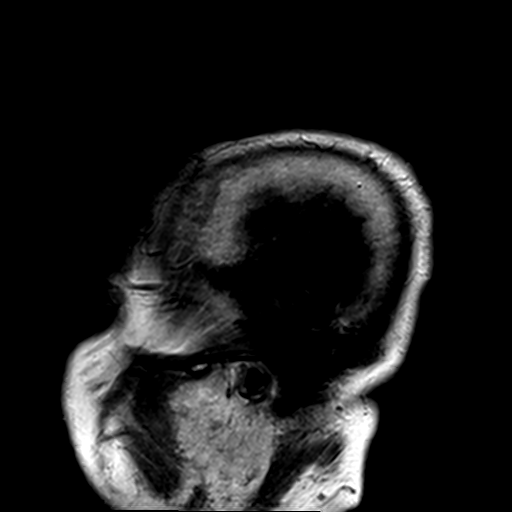
[im 21/21]
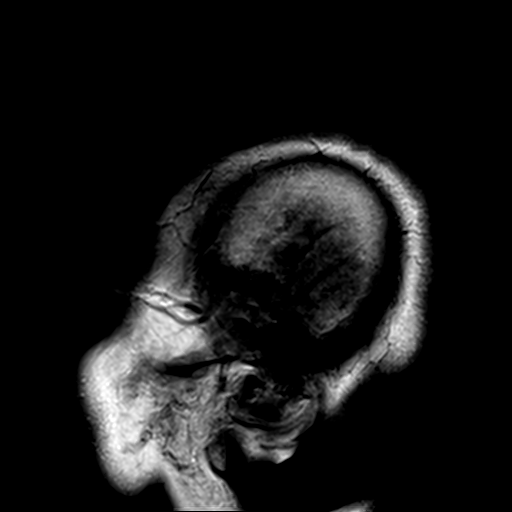

[Series 6: T2 · axial · 5.0mm · 0.51mm/px · z∈[-44,+97]mm · 3 of 23 slices shown (1 of 2)]
[im 1/23]
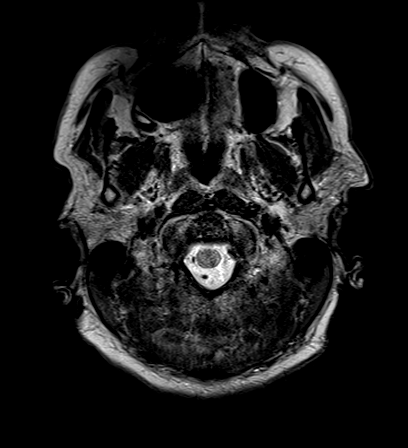
[im 12/23]
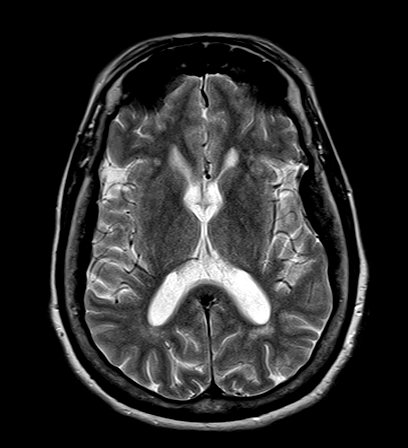
[im 23/23]
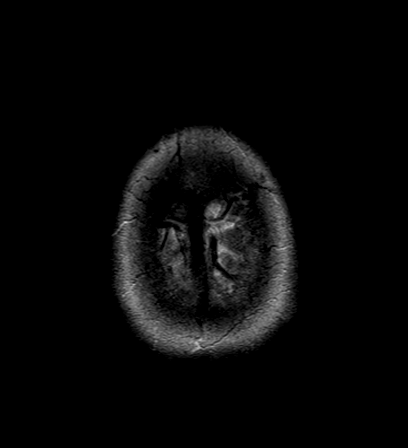

[Series 7: FLAIR · axial · 5.0mm · 0.36mm/px · z∈[-44,+97]mm · 3 of 23 slices shown]
[im 1/23]
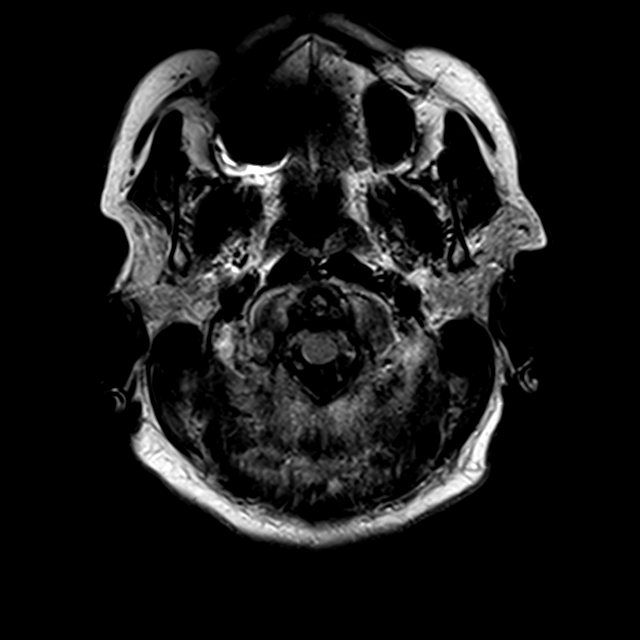
[im 12/23]
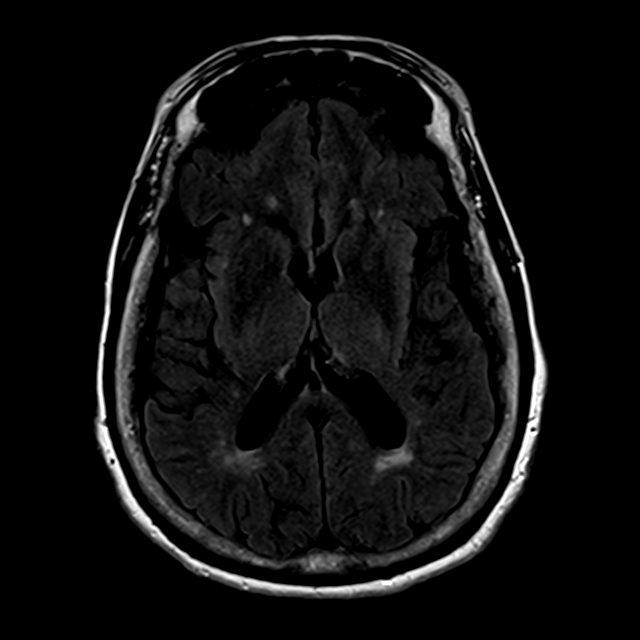
[im 23/23]
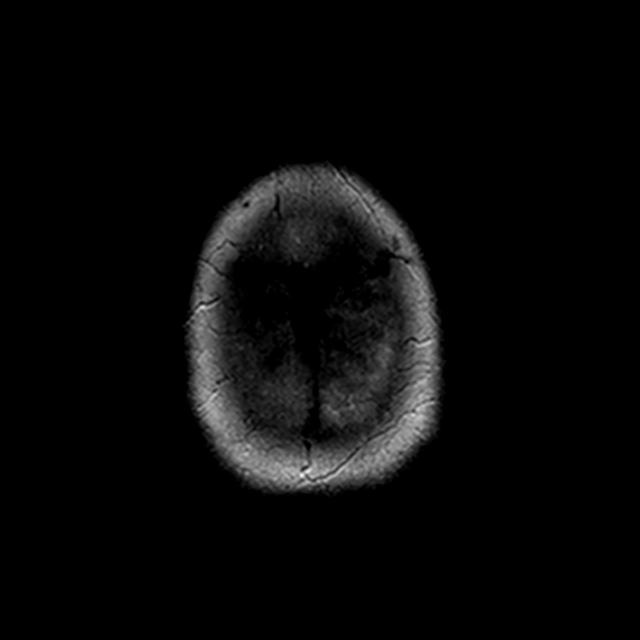

[Series 8: T1 · axial · 2.0mm · 0.45mm/px · z∈[-50,+104]mm · 8 of 79 slices shown (2 of 2)]
[im 1/79]
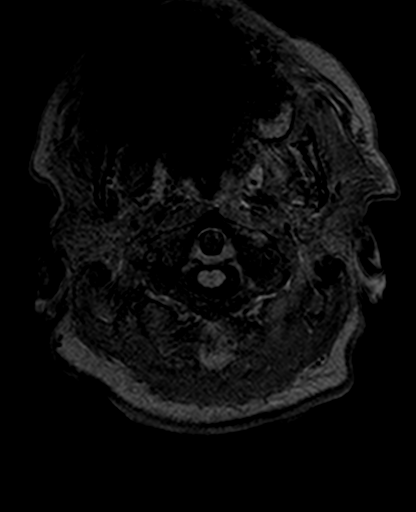
[im 10/79]
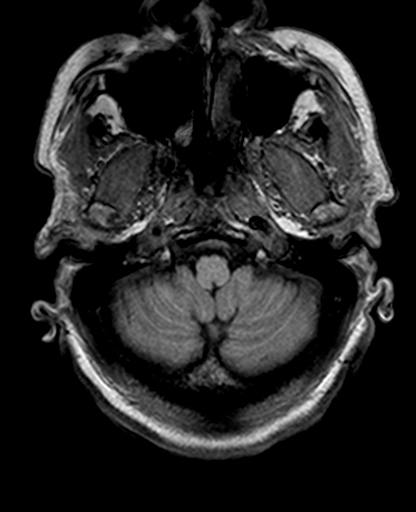
[im 20/79]
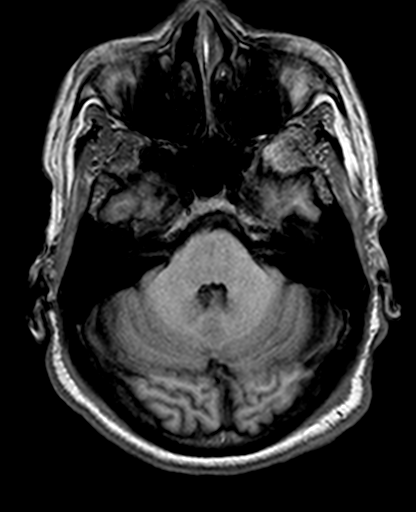
[im 30/79]
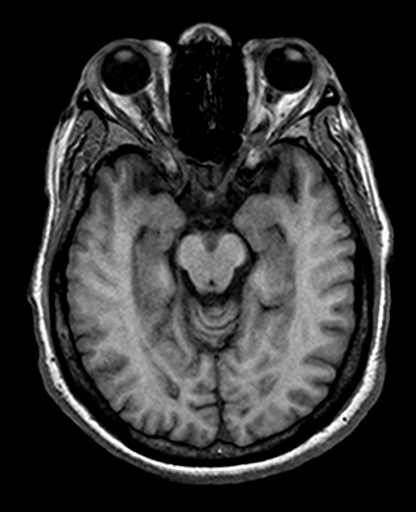
[im 49/79]
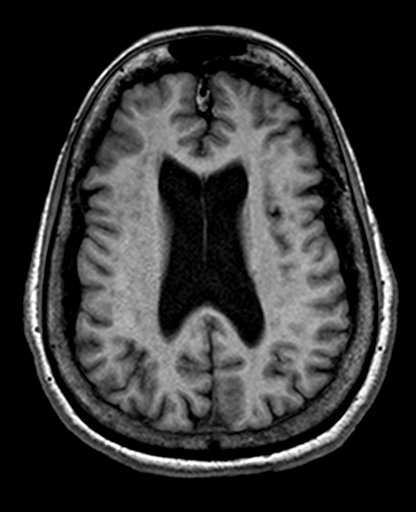
[im 59/79]
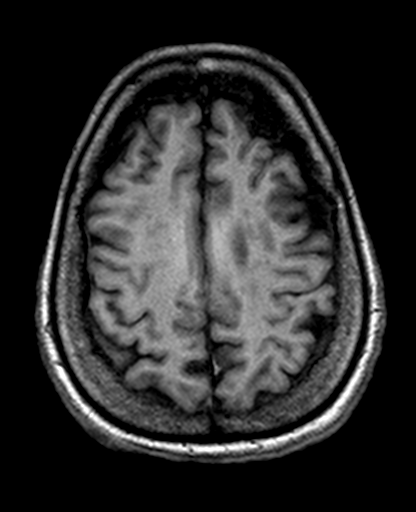
[im 69/79]
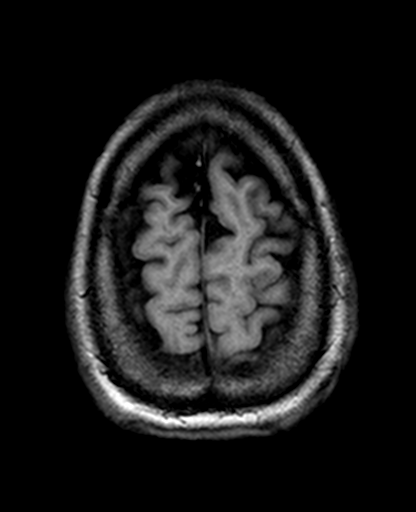
[im 79/79]
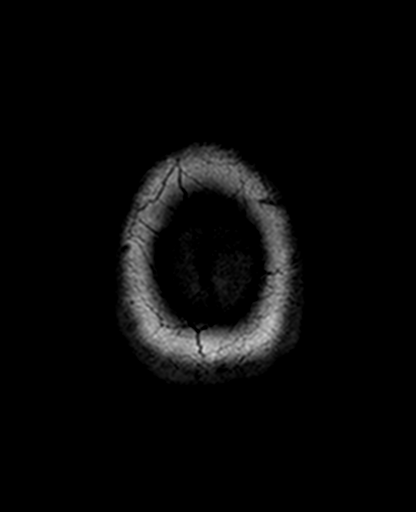

[Series 9: trauma axial · axial · 5.0mm · 0.46mm/px · 1 of 23 slices shown]
[im 1/23]
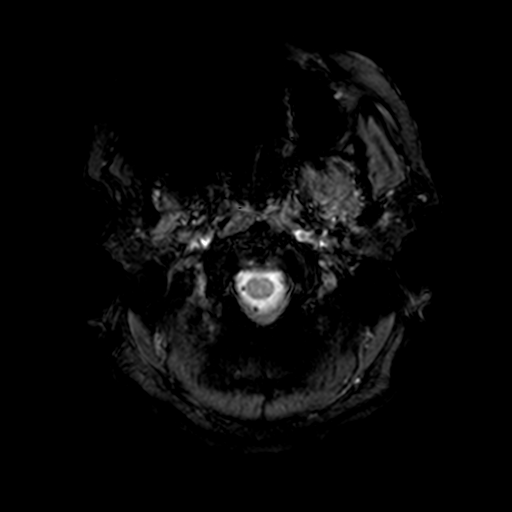

[Series 10: T2 · coronal · 5.0mm · 0.46mm/px · 4 of 30 slices shown (2 of 2)]
[im 1/30]
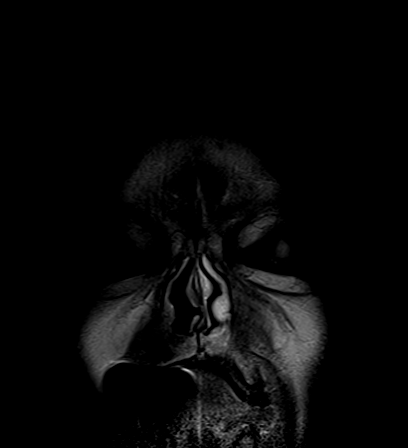
[im 10/30]
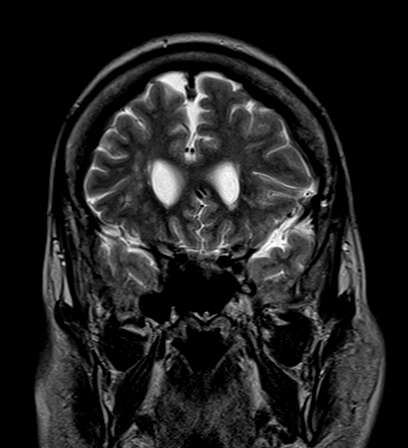
[im 20/30]
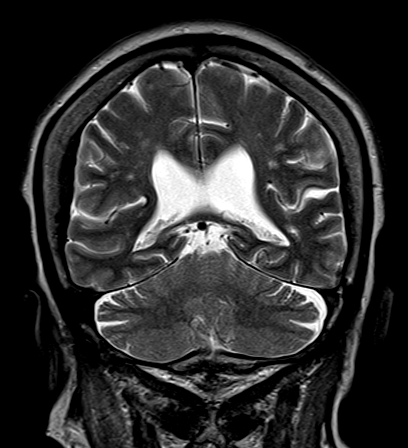
[im 30/30]
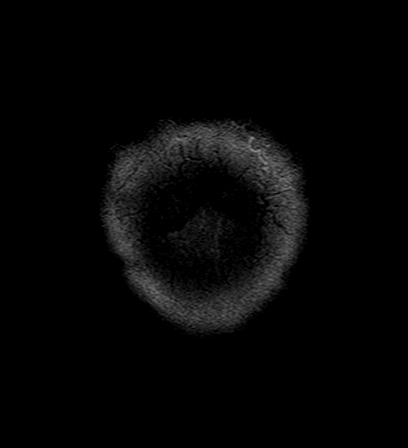

[21 of 48 positions shown; findings below may reference images not displayed]

FINDINGS: The diffusion-weighted images demonstrate no evidence for acute or
subacute infarction. Mild generalized atrophy is present. Ventricles
are moderately enlarged. This is somewhat disproportionate to the
degree of atrophy. Periventricular T2 changes are noted bilaterally.
Moderate diffuse subcortical white matter disease is present
bilaterally. No acute hemorrhage or mass lesion is present. There is
a remote lacunar infarct of the right pons.

The internal auditory canals are within normal limits bilaterally.
The brainstem and cerebellum are unremarkable. Flow is present in
the major intracranial arteries. The globes and orbits are intact.
The paranasal sinuses and mastoid air cells are clear.

There is a heterogeneous lesion in the left side of the sella
measuring 11 mm in cephalo caudad dimension. There is slight
rightward deviation of the pituitary stalk. The optic chiasm is
within normal limits. There is no definite invasion of the cavernous
sinus.
IMPRESSION: 1. 11 mm heterogeneous lesion along the left side of the sella. The
differential diagnosis includes a pituitary macroadenoma.
Craniopharyngioma is considered less likely. Metastatic disease is
also considered less likely. This does not appear to be any
aneurysm. Recommend nonemergent follow-up MRI the brain with
contrast to include pituitary imaging.
2. Moderate prominence of the lateral ventricles. Normal pressure
hydrocephalus is considered.
3. Moderate diffuse white matter disease bilaterally. This is
nonspecific, but likely reflects the sequela of chronic
microvascular ischemia. This appears to be long-standing.
